# Patient Record
Sex: Female | Born: 1955 | Race: White | Hispanic: No | Marital: Married | Smoking: Never smoker
Health system: Southern US, Community
[De-identification: ages and names within clinical notes are randomized; demographics above are authoritative.]

## PROBLEM LIST (undated history)

## (undated) DIAGNOSIS — E559 Vitamin D deficiency, unspecified: Secondary | ICD-10-CM

## (undated) HISTORY — PX: FOOT SURGERY: SHX648

## (undated) HISTORY — DX: Vitamin D deficiency, unspecified: E55.9

---

## 2002-06-01 ENCOUNTER — Other Ambulatory Visit: Admission: RE | Admit: 2002-06-01 | Discharge: 2002-06-01 | Payer: Self-pay | Admitting: Obstetrics and Gynecology

## 2003-09-05 ENCOUNTER — Other Ambulatory Visit: Admission: RE | Admit: 2003-09-05 | Discharge: 2003-09-05 | Payer: Self-pay | Admitting: Obstetrics and Gynecology

## 2003-09-19 ENCOUNTER — Encounter: Admission: RE | Admit: 2003-09-19 | Discharge: 2003-09-19 | Payer: Self-pay | Admitting: Obstetrics and Gynecology

## 2003-10-24 ENCOUNTER — Encounter (HOSPITAL_COMMUNITY): Admission: RE | Admit: 2003-10-24 | Discharge: 2004-01-22 | Payer: Self-pay | Admitting: Obstetrics and Gynecology

## 2004-05-24 ENCOUNTER — Encounter: Admission: RE | Admit: 2004-05-24 | Discharge: 2004-05-24 | Payer: Self-pay | Admitting: Obstetrics and Gynecology

## 2004-12-04 ENCOUNTER — Other Ambulatory Visit: Admission: RE | Admit: 2004-12-04 | Discharge: 2004-12-04 | Payer: Self-pay | Admitting: Obstetrics and Gynecology

## 2005-01-22 ENCOUNTER — Inpatient Hospital Stay (HOSPITAL_COMMUNITY): Admission: RE | Admit: 2005-01-22 | Discharge: 2005-01-23 | Payer: Self-pay | Admitting: Obstetrics and Gynecology

## 2005-05-14 ENCOUNTER — Encounter: Admission: RE | Admit: 2005-05-14 | Discharge: 2005-05-14 | Payer: Self-pay | Admitting: Obstetrics and Gynecology

## 2005-09-30 HISTORY — PX: TOTAL ABDOMINAL HYSTERECTOMY: SHX209

## 2006-05-12 ENCOUNTER — Encounter: Admission: RE | Admit: 2006-05-12 | Discharge: 2006-05-12 | Payer: Self-pay | Admitting: Obstetrics and Gynecology

## 2009-05-19 ENCOUNTER — Encounter (INDEPENDENT_AMBULATORY_CARE_PROVIDER_SITE_OTHER): Payer: Self-pay | Admitting: *Deleted

## 2009-10-13 ENCOUNTER — Telehealth: Payer: Self-pay | Admitting: Internal Medicine

## 2010-11-01 NOTE — Progress Notes (Signed)
Summary: Schedule Recall colon  Phone Note Outgoing Call Call back at [facility] Phone [phone] Call back at Work Phone [phone] Call back at cell   Call placed by: Christie Nottingham CMA Duncan Dull),  October 13, 2009 9:44 AM Call placed to: Patient Summary of Call: Called all numbers in EMR and in IDX and they all are disconnected or wrong numbers to schedule recall colonoscopy.  Initial call taken by: Christie Nottingham CMA Duncan Dull),  October 13, 2009 9:45 AM

## 2012-04-13 ENCOUNTER — Encounter: Payer: Self-pay | Admitting: Gastroenterology

## 2012-07-30 ENCOUNTER — Other Ambulatory Visit: Payer: Self-pay | Admitting: Obstetrics and Gynecology

## 2012-07-30 DIAGNOSIS — Z1231 Encounter for screening mammogram for malignant neoplasm of breast: Secondary | ICD-10-CM

## 2013-11-30 ENCOUNTER — Other Ambulatory Visit: Payer: Self-pay | Admitting: *Deleted

## 2013-11-30 DIAGNOSIS — Z803 Family history of malignant neoplasm of breast: Secondary | ICD-10-CM

## 2013-11-30 DIAGNOSIS — Z1231 Encounter for screening mammogram for malignant neoplasm of breast: Secondary | ICD-10-CM

## 2013-12-13 ENCOUNTER — Ambulatory Visit
Admission: RE | Admit: 2013-12-13 | Discharge: 2013-12-13 | Disposition: A | Discharge status: Home / Self Care | Source: Ambulatory Visit | Attending: *Deleted | Admitting: *Deleted

## 2013-12-13 DIAGNOSIS — Z1231 Encounter for screening mammogram for malignant neoplasm of breast: Secondary | ICD-10-CM

## 2013-12-13 DIAGNOSIS — Z803 Family history of malignant neoplasm of breast: Secondary | ICD-10-CM

## 2013-12-14 ENCOUNTER — Other Ambulatory Visit: Payer: Self-pay | Admitting: *Deleted

## 2013-12-14 DIAGNOSIS — R928 Other abnormal and inconclusive findings on diagnostic imaging of breast: Secondary | ICD-10-CM

## 2013-12-22 ENCOUNTER — Encounter: Payer: Self-pay | Admitting: Internal Medicine

## 2013-12-27 ENCOUNTER — Ambulatory Visit
Admission: RE | Admit: 2013-12-27 | Discharge: 2013-12-27 | Disposition: A | Discharge status: Home / Self Care | Source: Ambulatory Visit | Attending: *Deleted | Admitting: *Deleted

## 2013-12-27 ENCOUNTER — Other Ambulatory Visit: Payer: Self-pay | Admitting: *Deleted

## 2013-12-27 DIAGNOSIS — R928 Other abnormal and inconclusive findings on diagnostic imaging of breast: Secondary | ICD-10-CM

## 2013-12-27 DIAGNOSIS — N63 Unspecified lump in unspecified breast: Secondary | ICD-10-CM

## 2013-12-28 ENCOUNTER — Ambulatory Visit (AMBULATORY_SURGERY_CENTER): Payer: Self-pay

## 2013-12-28 VITALS — Ht 66.0 in | Wt 146.0 lb

## 2013-12-28 DIAGNOSIS — Z800 Family history of malignant neoplasm of digestive organs: Secondary | ICD-10-CM

## 2013-12-28 MED ORDER — MOVIPREP 100 G PO SOLR: 1.0000 | Freq: Once | ORAL | Status: DC

## 2013-12-31 ENCOUNTER — Ambulatory Visit
Admission: RE | Admit: 2013-12-31 | Discharge: 2013-12-31 | Disposition: A | Discharge status: Home / Self Care | Source: Ambulatory Visit | Attending: *Deleted | Admitting: *Deleted

## 2013-12-31 ENCOUNTER — Other Ambulatory Visit: Payer: Self-pay | Admitting: *Deleted

## 2013-12-31 DIAGNOSIS — N63 Unspecified lump in unspecified breast: Secondary | ICD-10-CM

## 2013-12-31 HISTORY — PX: BREAST CYST ASPIRATION: SHX578

## 2014-01-11 ENCOUNTER — Ambulatory Visit (AMBULATORY_SURGERY_CENTER): Admitting: Internal Medicine

## 2014-01-11 ENCOUNTER — Encounter: Payer: Self-pay | Admitting: Internal Medicine

## 2014-01-11 VITALS — BP 104/69 | HR 52 | Temp 98.1°F | Resp 17 | Ht 66.0 in | Wt 146.0 lb

## 2014-01-11 DIAGNOSIS — Z1211 Encounter for screening for malignant neoplasm of colon: Secondary | ICD-10-CM

## 2014-01-11 DIAGNOSIS — Z800 Family history of malignant neoplasm of digestive organs: Secondary | ICD-10-CM

## 2014-01-11 MED ORDER — SODIUM CHLORIDE 0.9 % IV SOLN: 500.0000 mL | INTRAVENOUS | Status: DC

## 2014-01-11 NOTE — Progress Notes (Signed)
Report to pacu rn, vss, bbs=clear 

## 2014-01-11 NOTE — Op Note (Signed)
[facility] [address] [place], [id number]   COLONOSCOPY PROCEDURE REPORT  PATIENT: Cathy Washington, Cathy A.  MR#: [id number] BIRTHDATE: [birth year] , 57  yrs. old GENDER: Female ENDOSCOPIST: Hart Carwinora M Klye Besecker, MD REFERRED BY:Dr Tally JoeKim Millsap PROCEDURE DATE:  01/11/2014 PROCEDURE:   Colonoscopy, screening First Screening Colonoscopy - Avg.  risk and is 50 yrs.  old or older - No.  Prior Negative Screening - Now for repeat screening. 10 or more years since last screening  History of Adenoma - Now for follow-up colonoscopy & has been > or = to 3 yrs.  N/A  Polyps Removed Today? No.  Recommend repeat exam, <10 yrs? Yes.  High risk (family or personal hx). ASA CLASS:   Class I INDICATIONS:Patient's immediate family history of colon cancer and 5 with colon cancer.  Last colonoscopy 2005 was normal. MEDICATIONS: MAC sedation, administered by CRNA and propofol (Diprivan) 250mg  IV  DESCRIPTION OF PROCEDURE:   After the risks benefits and alternatives of the procedure were thoroughly explained, informed consent was obtained.  A digital rectal exam revealed no abnormalities of the rectum.   The LB PFC-H190 [id number]  endoscope was introduced through the anus and advanced to the cecum, which was identified by both the appendix and ileocecal valve. No adverse events experienced.   The quality of the prep was excellent, using MoviPrep  The instrument was then slowly withdrawn as the colon was fully examined.      COLON FINDINGS: Mild diverticulosis was noted in the sigmoid colon. Retroflexed views revealed no abnormalities. The time to cecum=4 minutes 48 seconds.  Withdrawal time=8 minutes 09 seconds.  The scope was withdrawn and the procedure completed. COMPLICATIONS: There were no complications.  ENDOSCOPIC IMPRESSION: Mild diverticulosis was noted in the sigmoid colon  RECOMMENDATIONS: high fiber diet Recall colonoscopy in 5 years   eSigned:  Hart Carwinora M Patty Lopezgarcia, MD  01/11/2014 8:37 AM   cc:   PATIENT NAME:  Cathy Washington, Cathy A. MR#: [id number]

## 2014-01-11 NOTE — Patient Instructions (Signed)
Impressions/recommendations:  Diverticulosis (handout given) High Fiber Diet (handout given)  Repeat colonoscopy in 5 years.  YOU HAD AN ENDOSCOPIC PROCEDURE TODAY AT THE [facility]: Refer to the procedure report that was given to you for any specific questions about what was found during the examination.  If the procedure report does not answer your questions, please call your gastroenterologist to clarify.  If you requested that your care partner not be given the details of your procedure findings, then the procedure report has been included in a sealed envelope for you to review at your convenience later.  YOU SHOULD EXPECT: Some feelings of bloating in the abdomen. Passage of more gas than usual.  Walking can help get rid of the air that was put into your GI tract during the procedure and reduce the bloating. If you had a lower endoscopy (such as a colonoscopy or flexible sigmoidoscopy) you may notice spotting of blood in your stool or on the toilet paper. If you underwent a bowel prep for your procedure, then you may not have a normal bowel movement for a few days.  DIET: Your first meal following the procedure should be a light meal and then it is ok to progress to your normal diet.  A half-sandwich or bowl of soup is an example of a good first meal.  Heavy or fried foods are harder to digest and may make you feel nauseous or bloated.  Likewise meals heavy in dairy and vegetables can cause extra gas to form and this can also increase the bloating.  Drink plenty of fluids but you should avoid alcoholic beverages for 24 hours.  ACTIVITY: Your care partner should take you home directly after the procedure.  You should plan to take it easy, moving slowly for the rest of the day.  You can resume normal activity the day after the procedure however you should NOT DRIVE or use heavy machinery for 24 hours (because of the sedation medicines used during the test).    SYMPTOMS TO REPORT  IMMEDIATELY: A gastroenterologist can be reached at any hour.  During normal business hours, 8:30 AM to 5:00 PM Monday through Friday, call [phone].  After hours and on weekends, please call the GI answering service at [phone] who will take a message and have the physician on call contact you.   Following lower endoscopy (colonoscopy or flexible sigmoidoscopy):  Excessive amounts of blood in the stool  Significant tenderness or worsening of abdominal pains  Swelling of the abdomen that is new, acute  Fever of 100F or higher   FOLLOW UP: If any biopsies were taken you will be contacted by phone or by letter within the next 1-3 weeks.  Call your gastroenterologist if you have not heard about the biopsies in 3 weeks.  Our staff will call the home number listed on your records the next business day following your procedure to check on you and address any questions or concerns that you may have at that time regarding the information given to you following your procedure. This is a courtesy call and so if there is no answer at the home number and we have not heard from you through the emergency physician on call, we will assume that you have returned to your regular daily activities without incident.  SIGNATURES/CONFIDENTIALITY: You and/or your care partner have signed paperwork which will be entered into your electronic medical record.  These signatures attest to the fact that that the information above on  your After Visit Summary has been reviewed and is understood.  Full responsibility of the confidentiality of this discharge information lies with you and/or your care-partner. 

## 2014-01-12 ENCOUNTER — Telehealth: Payer: Self-pay | Admitting: *Deleted

## 2014-01-12 ENCOUNTER — Encounter: Payer: Self-pay | Admitting: *Deleted

## 2014-01-12 NOTE — Telephone Encounter (Signed)
Per RN opened in Error.

## 2014-01-12 NOTE — Telephone Encounter (Signed)
  Follow up Call-  Call back number 01/11/2014  Post procedure Call Back phone  # [phone]  Permission to leave phone message Yes     Patient questions:  Do you have a fever, pain , or abdominal swelling? no Pain Score  0 *  Have you tolerated food without any problems? yes  Have you been able to return to your normal activities? yes  Do you have any questions about your discharge instructions: Diet   no Medications  no Follow up visit  no  Do you have questions or concerns about your Care? no  Actions: * If pain score is 4 or above: No action needed, pain <4.

## 2015-07-28 ENCOUNTER — Encounter: Payer: Self-pay | Admitting: Internal Medicine

## 2016-04-29 ENCOUNTER — Other Ambulatory Visit: Payer: Self-pay | Admitting: *Deleted

## 2016-04-29 DIAGNOSIS — Z1231 Encounter for screening mammogram for malignant neoplasm of breast: Secondary | ICD-10-CM

## 2016-05-07 ENCOUNTER — Ambulatory Visit
Admission: RE | Admit: 2016-05-07 | Discharge: 2016-05-07 | Disposition: A | Discharge status: Home / Self Care | Source: Ambulatory Visit | Attending: *Deleted | Admitting: *Deleted

## 2016-05-07 DIAGNOSIS — Z1231 Encounter for screening mammogram for malignant neoplasm of breast: Secondary | ICD-10-CM

## 2017-07-17 ENCOUNTER — Other Ambulatory Visit: Payer: Self-pay | Admitting: *Deleted

## 2017-07-17 DIAGNOSIS — Z1231 Encounter for screening mammogram for malignant neoplasm of breast: Secondary | ICD-10-CM

## 2017-08-08 ENCOUNTER — Ambulatory Visit
Admission: RE | Admit: 2017-08-08 | Discharge: 2017-08-08 | Disposition: A | Discharge status: Home / Self Care | Source: Ambulatory Visit | Attending: *Deleted | Admitting: *Deleted

## 2017-08-08 DIAGNOSIS — Z1231 Encounter for screening mammogram for malignant neoplasm of breast: Secondary | ICD-10-CM

## 2018-07-27 ENCOUNTER — Other Ambulatory Visit: Payer: Self-pay | Admitting: *Deleted

## 2018-07-27 DIAGNOSIS — Z1231 Encounter for screening mammogram for malignant neoplasm of breast: Secondary | ICD-10-CM

## 2018-08-11 ENCOUNTER — Ambulatory Visit
Admission: RE | Admit: 2018-08-11 | Discharge: 2018-08-11 | Disposition: A | Discharge status: Home / Self Care | Source: Ambulatory Visit | Attending: *Deleted | Admitting: *Deleted

## 2018-08-11 DIAGNOSIS — Z1231 Encounter for screening mammogram for malignant neoplasm of breast: Secondary | ICD-10-CM

## 2019-02-05 ENCOUNTER — Encounter: Payer: Self-pay | Admitting: Gastroenterology

## 2020-05-31 ENCOUNTER — Other Ambulatory Visit: Payer: Self-pay | Admitting: *Deleted

## 2020-05-31 DIAGNOSIS — Z1231 Encounter for screening mammogram for malignant neoplasm of breast: Secondary | ICD-10-CM

## 2020-06-14 ENCOUNTER — Ambulatory Visit
Admission: RE | Admit: 2020-06-14 | Discharge: 2020-06-14 | Disposition: A | Discharge status: Home / Self Care | Source: Ambulatory Visit | Attending: *Deleted | Admitting: *Deleted

## 2020-06-14 ENCOUNTER — Other Ambulatory Visit: Payer: Self-pay

## 2020-06-14 DIAGNOSIS — Z1231 Encounter for screening mammogram for malignant neoplasm of breast: Secondary | ICD-10-CM

## 2020-06-16 ENCOUNTER — Other Ambulatory Visit: Payer: Self-pay | Admitting: *Deleted

## 2020-06-16 DIAGNOSIS — R928 Other abnormal and inconclusive findings on diagnostic imaging of breast: Secondary | ICD-10-CM

## 2020-06-28 ENCOUNTER — Ambulatory Visit
Admission: RE | Admit: 2020-06-28 | Discharge: 2020-06-28 | Disposition: A | Discharge status: Home / Self Care | Source: Ambulatory Visit | Attending: *Deleted | Admitting: *Deleted

## 2020-06-28 ENCOUNTER — Other Ambulatory Visit: Payer: Self-pay

## 2020-06-28 DIAGNOSIS — R928 Other abnormal and inconclusive findings on diagnostic imaging of breast: Secondary | ICD-10-CM

## 2021-05-25 ENCOUNTER — Other Ambulatory Visit: Payer: Self-pay | Admitting: *Deleted

## 2021-05-25 DIAGNOSIS — N6459 Other signs and symptoms in breast: Secondary | ICD-10-CM

## 2021-05-31 ENCOUNTER — Other Ambulatory Visit: Payer: Self-pay | Admitting: *Deleted

## 2021-05-31 DIAGNOSIS — N6459 Other signs and symptoms in breast: Secondary | ICD-10-CM

## 2021-07-05 ENCOUNTER — Ambulatory Visit

## 2021-07-05 ENCOUNTER — Ambulatory Visit
Admission: RE | Admit: 2021-07-05 | Discharge: 2021-07-05 | Disposition: A | Discharge status: Home / Self Care | Source: Ambulatory Visit | Attending: *Deleted | Admitting: *Deleted

## 2021-07-05 ENCOUNTER — Other Ambulatory Visit: Payer: Self-pay

## 2021-07-05 DIAGNOSIS — N6459 Other signs and symptoms in breast: Secondary | ICD-10-CM

## 2021-07-09 ENCOUNTER — Ambulatory Visit
Admission: RE | Admit: 2021-07-09 | Discharge: 2021-07-09 | Disposition: A | Discharge status: Home / Self Care | Source: Ambulatory Visit | Attending: *Deleted | Admitting: *Deleted

## 2021-07-09 ENCOUNTER — Other Ambulatory Visit: Payer: Self-pay

## 2021-07-09 DIAGNOSIS — N6459 Other signs and symptoms in breast: Secondary | ICD-10-CM

## 2021-07-16 ENCOUNTER — Other Ambulatory Visit: Payer: Self-pay | Admitting: *Deleted

## 2021-07-16 DIAGNOSIS — N6452 Nipple discharge: Secondary | ICD-10-CM

## 2021-07-30 ENCOUNTER — Other Ambulatory Visit: Payer: Self-pay

## 2021-07-30 ENCOUNTER — Ambulatory Visit
Admission: RE | Admit: 2021-07-30 | Discharge: 2021-07-30 | Disposition: A | Discharge status: Home / Self Care | Source: Ambulatory Visit | Attending: *Deleted | Admitting: *Deleted

## 2021-07-30 DIAGNOSIS — N6452 Nipple discharge: Secondary | ICD-10-CM

## 2021-07-30 MED ORDER — GADOBUTROL 1 MMOL/ML IV SOLN
7.0000 mL | Freq: Once | INTRAVENOUS | Status: AC | PRN
  Administered 2021-07-30: 7 mL via INTRAVENOUS

## 2023-03-14 IMAGING — MR MR BREAST BILAT WO/W CM
8 of 12 series · 33 of 48 positions shown · IV contrast (gadavist)
Comparison: Previous exam(s).

CLINICAL DATA: 65-year-old complaining of intermittent retraction
of the left nipple. Diagnostic bilateral mammogram and left
ultrasound performed on 07/09/2021 was negative. Family history of
breast cancer in a twin sister at the age of 45 and 2 maternal
aunts.

LABS:  None obtained at the time of imaging.
EXAM:
BILATERAL BREAST MRI WITH AND WITHOUT CONTRAST
TECHNIQUE: Multiplanar, multisequence MR images of both breasts were obtained
prior to and following the intravenous administration of 7 ml of
Gadavist

[Series 2: t2_tirm_tra ipat (a-p) · axial · 3.0mm · 0.78mm/px · 1 of 58 slices shown]
[im 1/58]
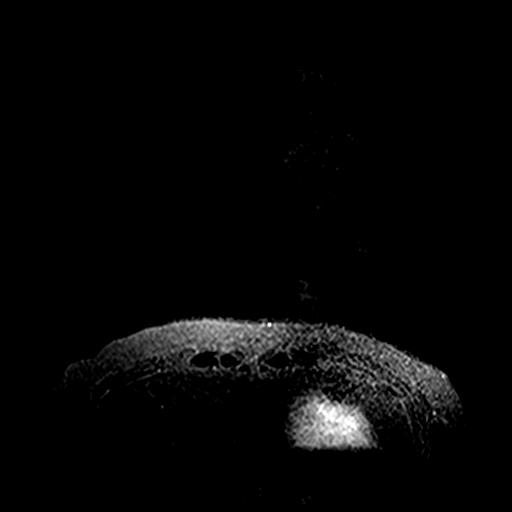

[Series 3: fl3d pre-cm no · axial · non-contrast · 1.2mm · 1.04mm/px · z∈[-35,+137]mm · 5 of 144 slices shown]
[im 1/144]
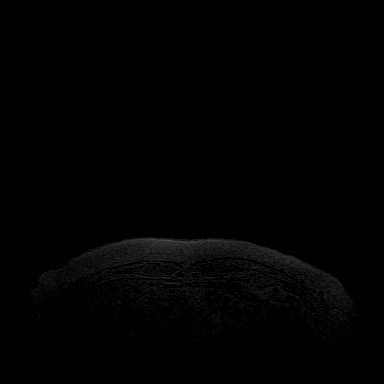
[im 36/144]
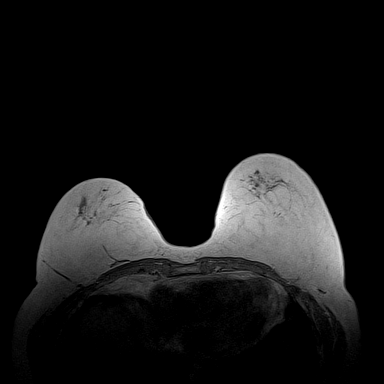
[im 72/144]
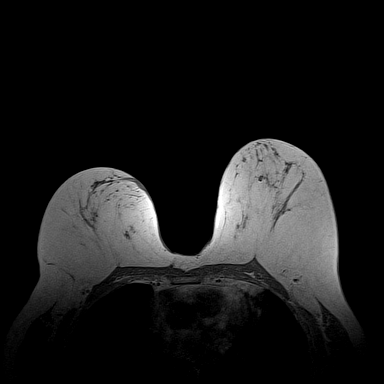
[im 108/144]
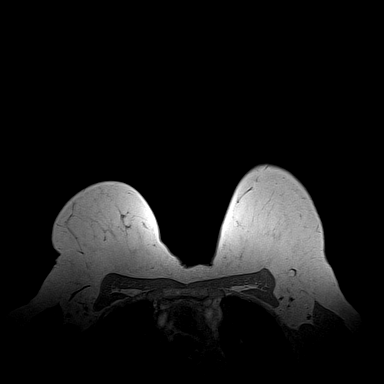
[im 144/144]
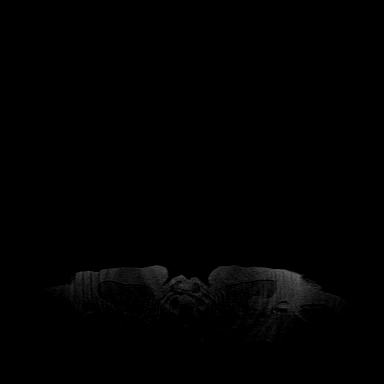

[Series 4: fl3d pre-cm · axial · non-contrast · 1.2mm · 1.04mm/px · z∈[-35,+137]mm · 5 of 144 slices shown]
[im 1/144]
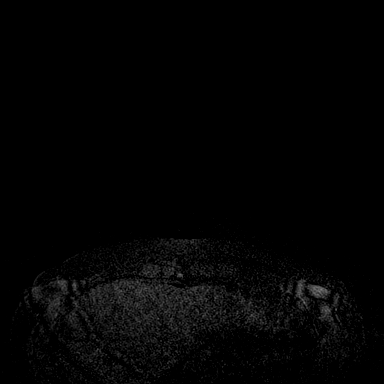
[im 36/144]
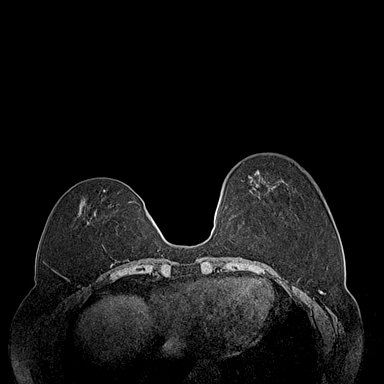
[im 72/144]
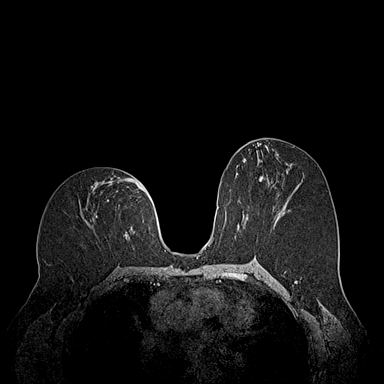
[im 108/144]
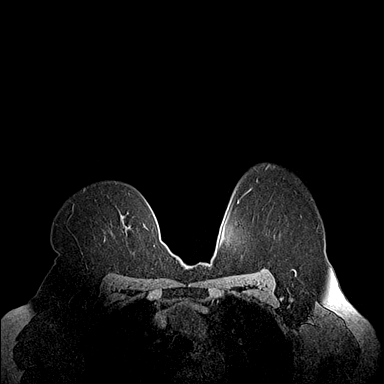
[im 144/144]
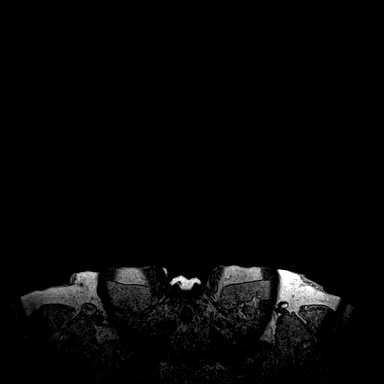

[Series 5: fl3d post-cm 20 · axial · 1.2mm · 1.04mm/px · z∈[-35,+137]mm · 5 of 144 slices shown (1 of 3)]
[im 1/144]
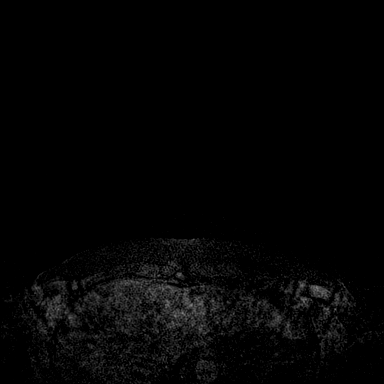
[im 36/144]
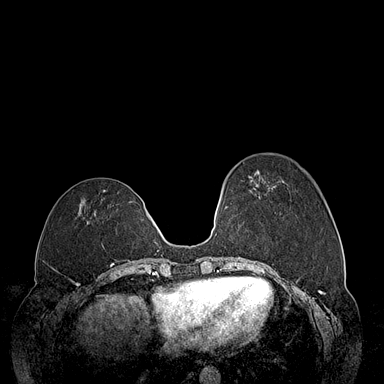
[im 72/144]
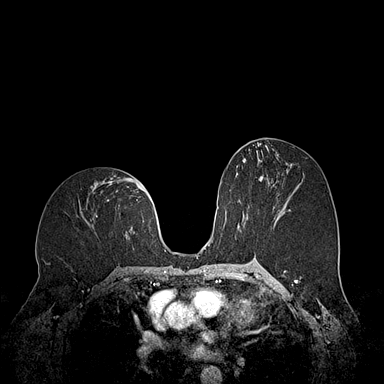
[im 108/144]
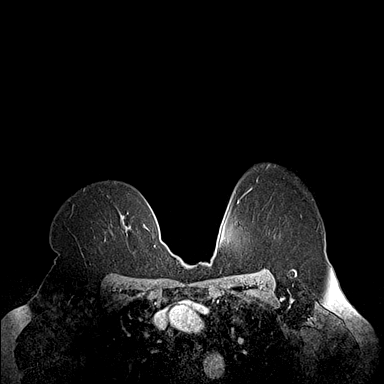
[im 144/144]
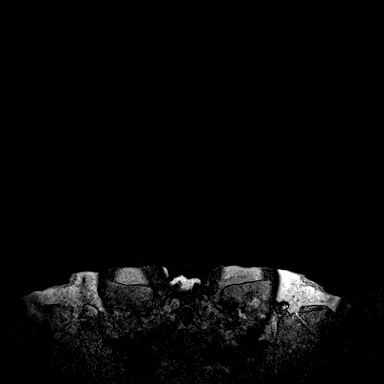

[Series 6: fl3d post-cm 20 · axial · 1.2mm · 1.04mm/px · z∈[-35,+137]mm · 5 of 144 slices shown (2 of 3)]
[im 1/144]
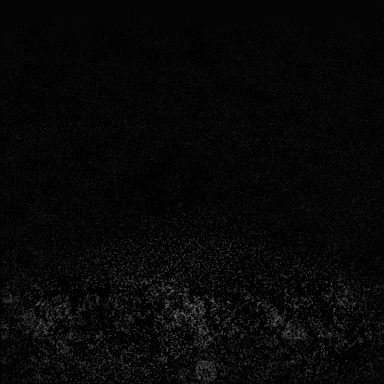
[im 36/144]
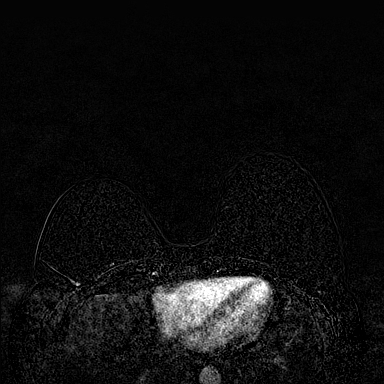
[im 72/144]
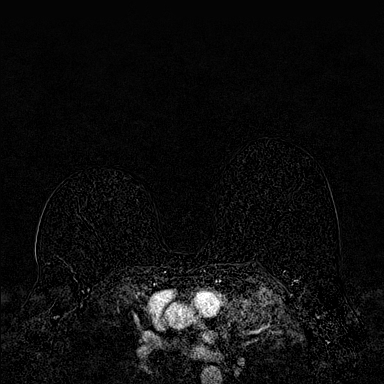
[im 108/144]
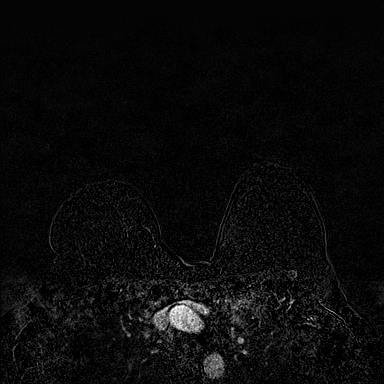
[im 144/144]
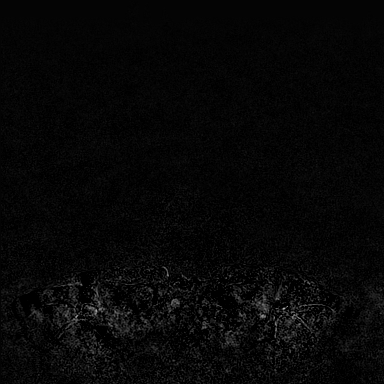

[Series 7: fl3d post-cm 20 · axial · 172.8mm · 1.04mm/px · 1 of 1 slices shown (3 of 3)]
[im 1/1]
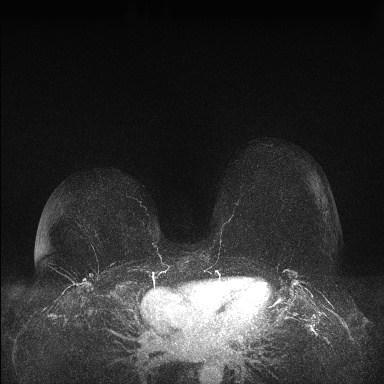

[Series 8: fl3d post-cm 3 · axial · 1.2mm · 1.04mm/px · z∈[-35,+137]mm · 6 of 144 slices shown (1 of 2)]
[im 1/144]
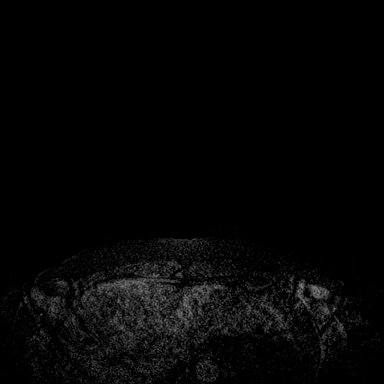
[im 29/144]
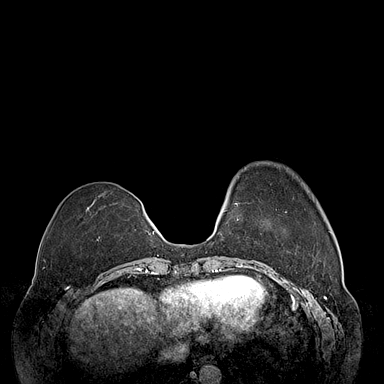
[im 58/144]
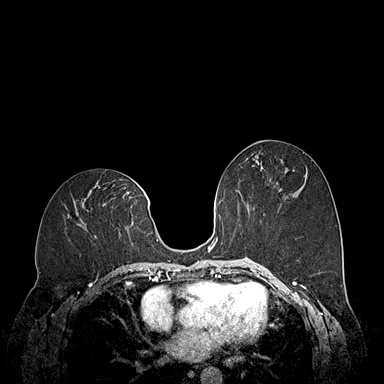
[im 86/144]
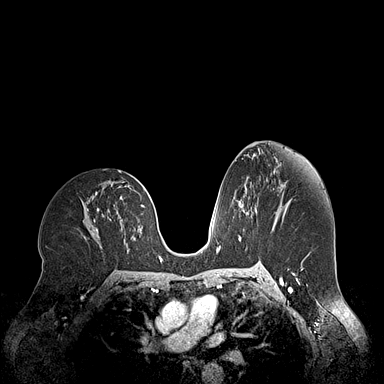
[im 115/144]
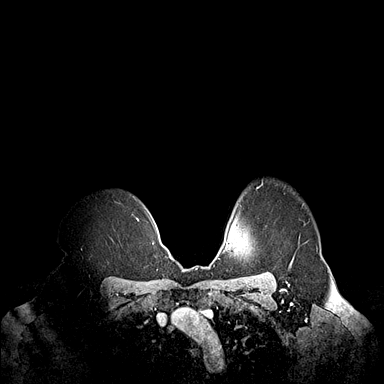
[im 144/144]
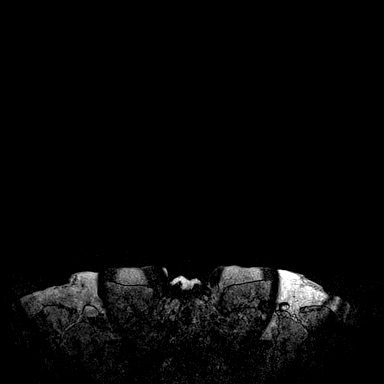

[Series 9: fl3d post-cm 3 · axial · 1.2mm · 1.04mm/px · z∈[-35,+102]mm · 5 of 144 slices shown (2 of 2)]
[im 1/144]
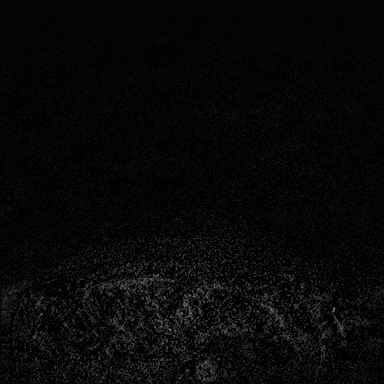
[im 29/144]
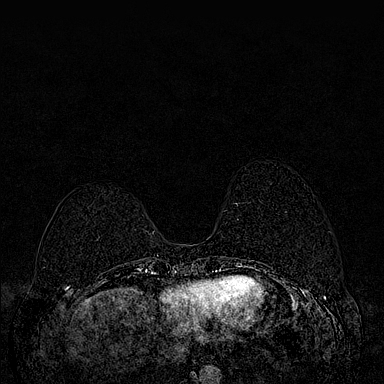
[im 58/144]
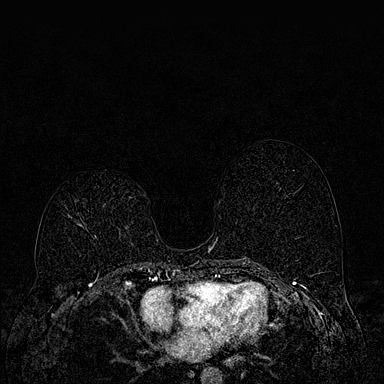
[im 86/144]
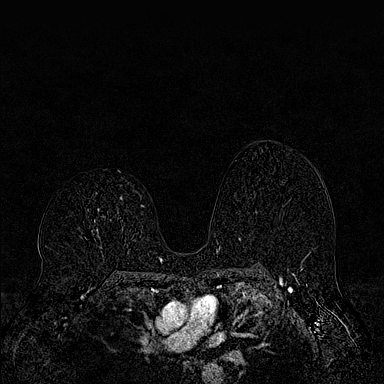
[im 115/144]
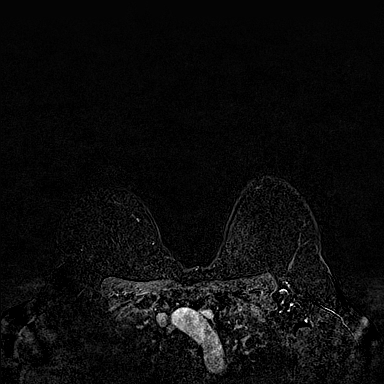

[33 of 48 positions shown; findings below may reference images not displayed]

Three-dimensional MR images were rendered by post-processing of the
original MR data on an independent workstation. The
three-dimensional MR images were interpreted, and findings are
reported in the following complete MRI report for this study. Three
dimensional images were evaluated at the independent interpreting
workstation using the DynaCAD thin client.
FINDINGS: Breast composition: a. Almost entirely fat.

Background parenchymal enhancement: Moderate.

Right breast: No mass or abnormal enhancement.

Left breast: No mass or abnormal enhancement.

Lymph nodes: No abnormal appearing lymph nodes.

Ancillary findings:  None.
IMPRESSION: No abnormal enhancement in either breast.

RECOMMENDATION:
Bilateral screening mammogram in 1 year is recommended.

BI-RADS CATEGORY  1: Negative.

## 2023-07-03 ENCOUNTER — Other Ambulatory Visit: Payer: Self-pay | Admitting: *Deleted

## 2023-07-03 DIAGNOSIS — Z1231 Encounter for screening mammogram for malignant neoplasm of breast: Secondary | ICD-10-CM

## 2023-07-12 ENCOUNTER — Ambulatory Visit
Admission: RE | Admit: 2023-07-12 | Discharge: 2023-07-12 | Disposition: A | Discharge status: Home / Self Care | Payer: Medicare Other | Source: Ambulatory Visit | Attending: *Deleted | Admitting: *Deleted

## 2023-07-12 DIAGNOSIS — Z1231 Encounter for screening mammogram for malignant neoplasm of breast: Secondary | ICD-10-CM

## 2024-06-01 ENCOUNTER — Other Ambulatory Visit: Payer: Self-pay | Admitting: *Deleted

## 2024-06-01 DIAGNOSIS — Z1231 Encounter for screening mammogram for malignant neoplasm of breast: Secondary | ICD-10-CM

## 2024-07-20 ENCOUNTER — Ambulatory Visit
Admission: RE | Admit: 2024-07-20 | Discharge: 2024-07-20 | Disposition: A | Discharge status: Home / Self Care | Source: Ambulatory Visit | Attending: *Deleted | Admitting: *Deleted

## 2024-07-20 DIAGNOSIS — Z1231 Encounter for screening mammogram for malignant neoplasm of breast: Secondary | ICD-10-CM
